# Patient Record
Sex: Female | Born: 1990
Health system: Southern US, Community
[De-identification: ages and names within clinical notes are randomized; demographics above are authoritative.]

---
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
## 2017-10-30 ENCOUNTER — Encounter: Payer: Self-pay | Admitting: Internal Medicine

## 2017-10-30 ENCOUNTER — Ambulatory Visit: Payer: Self-pay | Attending: Internal Medicine | Admitting: Internal Medicine

## 2017-10-30 VITALS — BP 118/87 | HR 85 | Temp 98.5°F | Resp 16 | Ht 63.0 in | Wt 255.0 lb

## 2017-10-30 DIAGNOSIS — N926 Irregular menstruation, unspecified: Secondary | ICD-10-CM

## 2017-10-30 DIAGNOSIS — Z131 Encounter for screening for diabetes mellitus: Secondary | ICD-10-CM

## 2017-10-30 DIAGNOSIS — Z124 Encounter for screening for malignant neoplasm of cervix: Secondary | ICD-10-CM

## 2017-10-30 DIAGNOSIS — Z833 Family history of diabetes mellitus: Secondary | ICD-10-CM | POA: Insufficient documentation

## 2017-10-30 DIAGNOSIS — Z6841 Body Mass Index (BMI) 40.0 and over, adult: Secondary | ICD-10-CM

## 2017-10-30 DIAGNOSIS — Z114 Encounter for screening for human immunodeficiency virus [HIV]: Secondary | ICD-10-CM

## 2017-10-30 DIAGNOSIS — G43009 Migraine without aura, not intractable, without status migrainosus: Secondary | ICD-10-CM

## 2017-10-30 LAB — POCT URINE PREGNANCY: PREG TEST UR: NEGATIVE

## 2017-10-30 MED ORDER — AMITRIPTYLINE HCL 10 MG PO TABS: 10.0000 mg | ORAL_TABLET | Freq: Every day | ORAL | 3 refills | Status: DC

## 2017-10-30 MED ORDER — NORGESTREL-ETHINYL ESTRADIOL 0.3-30 MG-MCG PO TABS: 1.0000 | ORAL_TABLET | Freq: Every day | ORAL | 3 refills | Status: DC

## 2017-10-30 MED FILL — AMITRIPTYLINE HCL 10 MG TAB: 10 | 30 days supply | Qty: 30 | Fill #0

## 2017-10-30 MED FILL — ELINEST-28 TABLET: 0.3-30 | 28 days supply | Qty: 28 | Fill #0

## 2017-10-30 NOTE — Progress Notes (Signed)
Pt states her menstrual are irregular  Pt states this past month she had her menstrual for the full month and it was heavy

## 2017-10-30 NOTE — Progress Notes (Signed)
Patient ID: Sheri Newman, female    DOB: February 20, 1991  MRN: [id number]  CC: New Patient (Initial Visit)   Subjective: Sheri Newman is a 27 y.o. female who presents for new patient visit. Her concerns today include:   Patient complains of irregular menses since junior high school.  Menarche started at the age of 27.  Menses were regular up until junior school.  After that menses with skipped several months sometimes almost a year.  Today she presents because she was bleeding daily for the past month.  The amount of bleeding during that month fluctuated from heavy to light.  When it was heavy she had to change her tampon about every 1 hour.  The bleeding just stopped a few days ago.  Prior to this bleeding episode her last.  Was sometime last year.  She is sexually active with one partner.  She denies any hirsutism.  She endorses headaches that occur about 2-3 times a week usually in both temple area.  Headaches usually come on suddenly and can be throbbing.  They last for up to 2 hours and is sometimes associated with nausea.  She denies photophobia but reports that the headaches are better with lying down in a quiet room.  She takes Tylenol with incomplete relief.  She does not identify any aggravating or initiating factors.  She denies any visual or other sensory or other type symptoms.  No formal diagnosis of migraines in the past.  No current outpatient medications on file prior to visit.   No current facility-administered medications on file prior to visit.     No Known Allergies  Social History   Socioeconomic History  . Marital status: Single    Spouse name: Not on file  . Number of children: Not on file  . Years of education: Not on file  . Highest education level: Not on file  Occupational History  . Not on file  Social Needs  . Financial resource strain: Not on file  . Food insecurity:    Worry: Not on file    Inability: Not on file  . Transportation needs:   Medical: Not on file    Non-medical: Not on file  Tobacco Use  . Smoking status: Never Smoker  . Smokeless tobacco: Never Used  Substance and Sexual Activity  . Alcohol use: Yes    Comment: occasionally  . Drug use: Never  . Sexual activity: Yes  Lifestyle  . Physical activity:    Days per week: Not on file    Minutes per session: Not on file  . Stress: Not on file  Relationships  . Social connections:    Talks on phone: Not on file    Gets together: Not on file    Attends religious service: Not on file    Active member of club or organization: Not on file    Attends meetings of clubs or organizations: Not on file    Relationship status: Not on file  . Intimate partner violence:    Fear of current or ex partner: Not on file    Emotionally abused: Not on file    Physically abused: Not on file    Forced sexual activity: Not on file  Other Topics Concern  . Not on file  Social History Narrative  . Not on file    Family History  Problem Relation Age of Onset  . Diabetes Mother   . Diabetes Maternal Grandmother     ROS: Review  of Systems Negative except as stated above PHYSICAL EXAM: BP 118/87   Pulse 85   Temp 98.5 F (36.9 C) (Oral)   Resp 16   Ht 5\' 3"  (1.6 m)   Wt 255 lb (115.7 kg)   SpO2 96%   BMI 45.17 kg/m   Physical Exam  General appearance - alert, well appearing, obese Hispanic female and in no distress Mental status - normal mood, behavior, speech, dress, motor activity, and thought processes Chest - clear to auscultation, no wheezes, rales or rhonchi, symmetric air entry Heart - normal rate, regular rhythm, normal S1, S2, no murmurs, rubs, clicks or gallops Neurological - cranial nerves II through XII intact, motor and sensory grossly normal bilaterally, Romberg sign negative, normal gait and station GU:  CMA Pollock present -no external vaginal lesion.  Cervix appears grossly normal.  No abnormal appearing discharge.  Uterus and ovaries feel  normal.  Results for orders placed or performed in visit on 10/30/17  POCT urine pregnancy  Result Value Ref Range   Preg Test, Ur Negative Negative    ASSESSMENT AND PLAN: 1. Irregular menses History suggests anovulatory cycle likely due to polycystic ovarian syndrome.  Patient is not wanting pregnancy at this time.  So we discussed putting her on birth control pills to regulate her cycles and she is agreeable.  She does not have any absolute contraindication to being on birth control pills.  History does suggest migraine but it is migraine without aura.  I went over slight risks of blood clots in the legs and lungs and told her signs/symptoms to watch out for. -We will check hormone levels including TSH and prolactin. Advised to apply for the orange card in the event we need to refer to gynecology. - POCT urine pregnancy - TSH - Prolactin - Follicle stimulating hormone - Luteinizing hormone - norgestrel-ethinyl estradiol (LO/OVRAL,CRYSELLE) 0.3-30 MG-MCG tablet; Take 1 tablet by mouth daily.  Dispense: 1 Package; Refill: 3  2. Class 3 severe obesity without serious comorbidity with body mass index (BMI) of 45.0 to 49.9 in adult, unspecified obesity type (HCC) Discuss importance of healthy eating habits and regular exercise to achieve weight loss.  Dietary counseling given and printed information also given.  Advised to engage in some form of aerobic activity at least 4 times a week for 30 to 45 minutes. - Hemoglobin A1c  3. Pap smear for cervical cancer screening - Cytology - PAP  4. Diabetes mellitus screening - Hemoglobin A1c  5. Migraine without aura and without status migrainosus, not intractable Discussed diagnosis with patient. Recommend ibuprofen 600 mg as needed for abortive therapy.  For prophylaxis we will try her with a low dose of amitriptyline. - amitriptyline (ELAVIL) 10 MG tablet; Take 1 tablet (10 mg total) by mouth at bedtime.  Dispense: 30 tablet; Refill: 3  6.  Screening for HIV (human immunodeficiency virus) - HIV antibody  Patient was given the opportunity to ask questions.  Patient verbalized understanding of the plan and was able to repeat key elements of the plan.   Orders Placed This Encounter  Procedures  . TSH  . Prolactin  . Follicle stimulating hormone  . Luteinizing hormone  . Hemoglobin A1c  . HIV antibody  . POCT urine pregnancy     Requested Prescriptions   Signed Prescriptions Disp Refills  . norgestrel-ethinyl estradiol (LO/OVRAL,CRYSELLE) 0.3-30 MG-MCG tablet 1 Package 3    Sig: Take 1 tablet by mouth daily.  Marland Kitchen amitriptyline (ELAVIL) 10 MG tablet 30 tablet  3    Sig: Take 1 tablet (10 mg total) by mouth at bedtime.    Return in about 6 weeks (around 12/11/2017).  Jonah Blue, MD, FACP

## 2017-10-30 NOTE — Patient Instructions (Addendum)
Follow a Healthy Eating Plan - You can do it! Limit sugary drinks.  Avoid sodas, sweet tea, sport or energy drinks, or fruit drinks.  Drink water, lo-fat milk, or diet drinks. Limit snack foods.   Cut back on candy, cake, cookies, chips, ice cream.  These are a special treat, only in small amounts. Eat plenty of vegetables.  Especially dark green, red, and orange vegetables. Aim for at least 3 servings a day. More is better! Include fruit in your daily diet.  Whole fruit is much healthier than fruit juice! Limit "white" bread, "white" pasta, "white" rice.   Choose "100% whole grain" products, brown or wild rice. Avoid fatty meats. Try "Meatless Monday" and choose eggs or beans one day a week.  When eating meat, choose lean meats like chicken, [place], and fish.  Grill, broil, or bake meats instead of frying, and eat poultry without the skin. Eat less salt.  Avoid frozen pizzas, frozen dinners and salty foods.  Use seasonings other than salt in cooking.  This can help blood pressure and keep you from swelling Beer, wine and liquor have calories.  If you can safely drink alcohol, limit to 1 drink per day for women, 2 drinks for men   Polycystic Ovarian Syndrome Polycystic ovarian syndrome (PCOS) is a common hormonal disorder among women of reproductive age. In most women with PCOS, many small fluid-filled sacs (cysts) grow on the ovaries, and the cysts are not part of a normal menstrual cycle. PCOS can cause problems with your menstrual periods and make it difficult to get pregnant. It can also cause an increased risk of miscarriage with pregnancy. If it is not treated, PCOS can lead to serious health problems, such as diabetes and heart disease. What are the causes? The cause of PCOS is not known, but it may be the result of a combination of certain factors, such as:  Irregular menstrual cycle.  High levels of certain hormones (androgens).  Problems with the hormone that helps to control blood  sugar (insulin resistance).  Certain genes.  What increases the risk? This condition is more likely to develop in women who have a family history of PCOS. What are the signs or symptoms? Symptoms of PCOS may include:  Multiple ovarian cysts.  Infrequent periods or no periods.  Periods that are too frequent or too heavy.  Unpredictable periods.  Inability to get pregnant (infertility) because of not ovulating.  Increased growth of hair on the face, chest, stomach, back, thumbs, thighs, or toes.  Acne or oily skin. Acne may develop during adulthood, and it may not respond to treatment.  Pelvic pain.  Weight gain or obesity.  Patches of thickened and dark brown or black skin on the neck, arms, breasts, or thighs (acanthosis nigricans).  Excess hair growth on the face, chest, abdomen, or upper thighs (hirsutism).  How is this diagnosed? This condition is diagnosed based on:  Your medical history.  A physical exam, including a pelvic exam. Your health care provider may look for areas of increased hair growth on your skin.  Tests, such as: ? Ultrasound. This may be used to examine the ovaries and the lining of the uterus (endometrium) for cysts. ? Blood tests. These may be used to check levels of sugar (glucose), female hormone (testosterone), and female hormones (estrogen and progesterone) in your blood.  How is this treated? There is no cure for PCOS, but treatment can help to manage symptoms and prevent more health problems from developing. Treatment  varies depending on:  Your symptoms.  Whether you want to have a baby or whether you need birth control (contraception).  Treatment may include nutrition and lifestyle changes along with:  Progesterone hormone to start a menstrual period.  Birth control pills to help you have regular menstrual periods.  Medicines to make you ovulate, if you want to get pregnant.  Medicine to reduce excessive hair growth.  Surgery, in  severe cases. This may involve making small holes in one or both of your ovaries. This decreases the amount of testosterone that your body produces.  Follow these instructions at home:  Take over-the-counter and prescription medicines only as told by your health care provider.  Follow a healthy meal plan. This can help you reduce the effects of PCOS. ? Eat a healthy diet that includes lean proteins, complex carbohydrates, fresh fruits and vegetables, low-fat dairy products, and healthy fats. Make sure to eat enough fiber.  If you are overweight, lose weight as told by your health care provider. ? Losing 10% of your body weight may improve symptoms. ? Your health care provider can determine how much weight loss is best for you and can help you lose weight safely.  Keep all follow-up visits as told by your health care provider. This is important. Contact a health care provider if:  Your symptoms do not get better with medicine.  You develop new symptoms. This information is not intended to replace advice given to you by your health care provider. Make sure you discuss any questions you have with your health care provider. Document Released: 08/02/2004 Document Revised: 12/05/2015 Document Reviewed: 09/24/2015 Elsevier Interactive Patient Education  2018 [employer].   Migraine Headache A migraine headache is a very strong throbbing pain on one side or both sides of your head. Migraines can also cause other symptoms. Talk with your doctor about what things may bring on (trigger) your migraine headaches. Follow these instructions at home: Medicines  Take over-the-counter and prescription medicines only as told by your doctor.  Do not drive or use heavy machinery while taking prescription pain medicine.  To prevent or treat constipation while you are taking prescription pain medicine, your doctor may recommend that you: ? Drink enough fluid to keep your pee (urine) clear or pale  yellow. ? Take over-the-counter or prescription medicines. ? Eat foods that are high in fiber. These include fresh fruits and vegetables, whole grains, and beans. ? Limit foods that are high in fat and processed sugars. These include fried and sweet foods. Lifestyle  Avoid alcohol.  Do not use any products that contain nicotine or tobacco, such as cigarettes and e-cigarettes. If you need help quitting, ask your doctor.  Get at least 8 hours of sleep every night.  Limit your stress. General instructions   Keep a journal to find out what may bring on your migraines. For example, write down: ? What you eat and drink. ? How much sleep you get. ? Any change in what you eat or drink. ? Any change in your medicines.  If you have a migraine: ? Avoid things that make your symptoms worse, such as bright lights. ? It may help to lie down in a dark, quiet room. ? Do not drive or use heavy machinery. ? Ask your doctor what activities are safe for you.  Keep all follow-up visits as told by your doctor. This is important. Contact a doctor if:  You get a migraine that is different or worse than your  usual migraines. Get help right away if:  Your migraine gets very bad.  You have a fever.  You have a stiff neck.  You have trouble seeing.  Your muscles feel weak or like you cannot control them.  You start to lose your balance a lot.  You start to have trouble walking.  You pass out (faint). This information is not intended to replace advice given to you by your health care provider. Make sure you discuss any questions you have with your health care provider. Document Released: 01/16/2008 Document Revised: 10/27/2015 Document Reviewed: 09/25/2015 Elsevier Interactive Patient Education  2018 [employer].

## 2017-10-31 LAB — LUTEINIZING HORMONE: LH: 9.4 m[IU]/mL

## 2017-10-31 LAB — HEMOGLOBIN A1C
ESTIMATED AVERAGE GLUCOSE: 103 mg/dL
Hgb A1c MFr Bld: 5.2 % (ref 4.8–5.6)

## 2017-10-31 LAB — TSH: TSH: 2.16 u[IU]/mL (ref 0.450–4.500)

## 2017-10-31 LAB — PROLACTIN: PROLACTIN: 20.9 ng/mL (ref 4.8–23.3)

## 2017-10-31 LAB — FOLLICLE STIMULATING HORMONE: FSH: 6.8 m[IU]/mL

## 2017-10-31 LAB — HIV ANTIBODY (ROUTINE TESTING W REFLEX): HIV Screen 4th Generation wRfx: NONREACTIVE

## 2017-11-04 LAB — CYTOLOGY - PAP
BACTERIAL VAGINITIS: NEGATIVE
CANDIDA VAGINITIS: POSITIVE — AB
CHLAMYDIA, DNA PROBE: NEGATIVE
Neisseria Gonorrhea: NEGATIVE
Trichomonas: NEGATIVE

## 2017-11-05 ENCOUNTER — Telehealth: Payer: Self-pay

## 2017-11-05 ENCOUNTER — Other Ambulatory Visit: Payer: Self-pay | Admitting: Internal Medicine

## 2017-11-05 DIAGNOSIS — R87612 Low grade squamous intraepithelial lesion on cytologic smear of cervix (LGSIL): Secondary | ICD-10-CM

## 2017-11-05 MED ORDER — FLUCONAZOLE 150 MG PO TABS: 150.0000 mg | ORAL_TABLET | Freq: Once | ORAL | 0 refills | Status: AC

## 2017-11-05 MED FILL — FLUCONAZOLE 150 MG TABS: 150 | 1 days supply | Qty: 1 | Fill #0

## 2017-11-05 NOTE — Telephone Encounter (Signed)
Contacted pt to go over lab results pt is aware and doesn't have any questions or concerns 

## 2017-11-12 ENCOUNTER — Ambulatory Visit: Payer: Self-pay | Attending: Internal Medicine

## 2017-12-08 MED FILL — ELINEST-28 TABLET: 0.3-30 | 28 days supply | Qty: 28 | Fill #1

## 2017-12-11 ENCOUNTER — Encounter: Payer: Self-pay | Admitting: Internal Medicine

## 2017-12-11 ENCOUNTER — Ambulatory Visit: Payer: Self-pay | Attending: Internal Medicine | Admitting: Internal Medicine

## 2017-12-11 VITALS — BP 122/86 | HR 74 | Temp 98.6°F | Resp 16 | Wt 250.6 lb

## 2017-12-11 DIAGNOSIS — Z6841 Body Mass Index (BMI) 40.0 and over, adult: Secondary | ICD-10-CM | POA: Insufficient documentation

## 2017-12-11 DIAGNOSIS — N926 Irregular menstruation, unspecified: Secondary | ICD-10-CM

## 2017-12-11 DIAGNOSIS — R87612 Low grade squamous intraepithelial lesion on cytologic smear of cervix (LGSIL): Secondary | ICD-10-CM | POA: Insufficient documentation

## 2017-12-11 DIAGNOSIS — G43009 Migraine without aura, not intractable, without status migrainosus: Secondary | ICD-10-CM | POA: Insufficient documentation

## 2017-12-11 DIAGNOSIS — N921 Excessive and frequent menstruation with irregular cycle: Secondary | ICD-10-CM | POA: Insufficient documentation

## 2017-12-11 DIAGNOSIS — Z833 Family history of diabetes mellitus: Secondary | ICD-10-CM | POA: Insufficient documentation

## 2017-12-11 MED ORDER — PROPRANOLOL HCL 10 MG PO TABS: 5.0000 mg | ORAL_TABLET | Freq: Two times a day (BID) | ORAL | 1 refills | Status: DC

## 2017-12-11 MED FILL — PROPRANOLOL 10 MG TABLET: 10 | 30 days supply | Qty: 30 | Fill #0

## 2017-12-11 NOTE — Progress Notes (Signed)
Patient ID: Sheri Newman, female    DOB: 11/28/1990  MRN: [id number]  CC: Follow-up   Subjective: Sheri Newman is a 27 y.o. female who presents for 6 wks f/u for irregular menses and HA Her concerns today include:  Pt with hx of obesity, irregular menses, abn PAP, migraine without aura  Irregular menses: started on BCP on last visit.  Had menses one time since started BCP.  Menses lasted 8 days and was very heavy from 3rd to 5th day.  + clots, moderate cramps.  Had to change tampons every 1 hr to prevent from soaking through.  -hormone levels were okay.  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  Referred to GYN.  She was declined the orange card.  She is afraid that she would not be able to afford to see the gynecologist. -migraines no worse with being on BCP.  Still gets them several times a wk despite the Elavil.  Patient Active Problem List   Diagnosis Date Noted  . Irregular menses 10/30/2017  . Class 3 severe obesity without serious comorbidity with body mass index (BMI) of 45.0 to 49.9 in adult (HCC) 10/30/2017  . Migraine without aura and without status migrainosus, not intractable 10/30/2017     Current Outpatient Medications on File Prior to Visit  Medication Sig Dispense Refill  . norgestrel-ethinyl estradiol (LO/OVRAL,CRYSELLE) 0.3-30 MG-MCG tablet Take 1 tablet by mouth daily. 1 Package 3   No current facility-administered medications on file prior to visit.     No Known Allergies  Social History   Socioeconomic History  . Marital status: Single    Spouse name: Not on file  . Number of children: Not on file  . Years of education: Not on file  . Highest education level: Not on file  Occupational History  . Not on file  Social Needs  . Financial resource strain: Not on file  . Food insecurity:    Worry: Not on file    Inability: Not on file  . Transportation needs:    Medical: Not on file    Non-medical: Not on file  Tobacco Use  . Smoking  status: Never Smoker  . Smokeless tobacco: Never Used  Substance and Sexual Activity  . Alcohol use: Yes    Comment: occasionally  . Drug use: Never  . Sexual activity: Yes  Lifestyle  . Physical activity:    Days per week: Not on file    Minutes per session: Not on file  . Stress: Not on file  Relationships  . Social connections:    Talks on phone: Not on file    Gets together: Not on file    Attends religious service: Not on file    Active member of club or organization: Not on file    Attends meetings of clubs or organizations: Not on file    Relationship status: Not on file  . Intimate partner violence:    Fear of current or ex partner: Not on file    Emotionally abused: Not on file    Physically abused: Not on file    Forced sexual activity: Not on file  Other Topics Concern  . Not on file  Social History Narrative  . Not on file    Family History  Problem Relation Age of Onset  . Diabetes Mother   . Diabetes Maternal Grandmother     ROS: Review of Systems Negative except as stated above PHYSICAL EXAM: BP 122/86   Pulse  74   Temp 98.6 F (37 C) (Oral)   Resp 16   Wt 250 lb 9.6 oz (113.7 kg)   SpO2 97%   BMI 44.39 kg/m   Physical Exam  General appearance - alert, well appearing, and in no distress Mental status - normal mood, behavior, speech, dress, motor activity, and thought processes Chest - clear to auscultation, no wheezes, rales or rhonchi, symmetric air entry Heart - normal rate, regular rhythm, normal S1, S2, no murmurs, rubs, clicks or gallops Extremities - peripheral pulses normal, no pedal edema, no clubbing or cyanosis Results for orders placed or performed in visit on 10/30/17  TSH  Result Value Ref Range   TSH 2.160 0.450 - 4.500 uIU/mL  Prolactin  Result Value Ref Range   Prolactin 20.9 4.8 - 23.3 ng/mL  Follicle stimulating hormone  Result Value Ref Range   FSH 6.8 mIU/mL  Luteinizing hormone  Result Value Ref Range   LH 9.4  mIU/mL  Hemoglobin A1c  Result Value Ref Range   Hgb A1c MFr Bld 5.2 4.8 - 5.6 %   Est. average glucose Bld gHb Est-mCnc 103 mg/dL  HIV antibody  Result Value Ref Range   HIV Screen 4th Generation wRfx Non Reactive Non Reactive  POCT urine pregnancy  Result Value Ref Range   Preg Test, Ur Negative Negative  Cytology - PAP  Result Value Ref Range   Adequacy (A)     Satisfactory for evaluation  endocervical/transformation zone component PRESENT.   Diagnosis (A)     - LOW GRADE SQUAMOUS INTRAEPITHELIAL LESION: CIN-1/ HPV (LSIL)   Diagnosis (A)     -  FUNGAL ORGANISMS PRESENT CONSISTENT WITH CANDIDA SPP.   Chlamydia Negative    Neisseria gonorrhea Negative    Bacterial vaginitis Negative for Bacterial Vaginitis Microorganisms    Candida vaginitis POSITIVE for Candida species (A)    Trichomonas Negative    Material Submitted CervicoVaginal Pap [ThinPrep Imaged] (A)     ASSESSMENT AND PLAN: 1. Irregular menses 2. Menorrhagia with irregular cycle - CBC - US Pelvis Complete; Future - US Transvaginal Non-OB; Future  3. LGSIL on Pap smear of cervix I have notified our referral coordinator that patient was not approved for the orange card.  She will meet with her today to give her options for seeing a gynecologist.  I stressed to the patient that this is of utmost importance that she follow-up on this.  4. Migraine without aura and without status migrainosus, not intractable Stop Elavil - propranolol (INDERAL) 10 MG tablet; Take 0.5 tablets (5 mg total) by mouth 2 (two) times daily.  Dispense: 60 tablet; Refill: 1   Patient was given the opportunity to ask questions.  Patient verbalized understanding of the plan and was able to repeat key elements of the plan.   Orders Placed This Encounter  Procedures  . US Pelvis Complete  . US Transvaginal Non-OB  . CBC     Requested Prescriptions   Signed Prescriptions Disp Refills  . propranolol (INDERAL) 10 MG tablet 60 tablet 1     Sig: Take 0.5 tablets (5 mg total) by mouth 2 (two) times daily.    Return in about 2 months (around 02/10/2018).  Sheri Blue, MD, FACP

## 2017-12-11 NOTE — Patient Instructions (Signed)
Stop Amitriptyline. Start Propranolol 10 mg 1/2 tab twice a day.   You need tto see gynecology for the abnormal pap smear.

## 2017-12-12 ENCOUNTER — Telehealth (HOSPITAL_COMMUNITY): Payer: Self-pay

## 2017-12-12 LAB — CBC
Hematocrit: 40.1 % (ref 34.0–46.6)
Hemoglobin: 13.6 g/dL (ref 11.1–15.9)
MCH: 28.3 pg (ref 26.6–33.0)
MCHC: 33.9 g/dL (ref 31.5–35.7)
MCV: 83 fL (ref 79–97)
PLATELETS: 256 10*3/uL (ref 150–450)
RBC: 4.81 x10E6/uL (ref 3.77–5.28)
RDW: 13.3 % (ref 12.3–15.4)
WBC: 7 10*3/uL (ref 3.4–10.8)

## 2017-12-12 NOTE — Telephone Encounter (Signed)
Tried to reach I left a message to call [facility] °

## 2017-12-17 ENCOUNTER — Ambulatory Visit (HOSPITAL_COMMUNITY)
Admission: RE | Admit: 2017-12-17 | Discharge: 2017-12-17 | Disposition: A | Discharge status: Home / Self Care | Payer: Self-pay | Source: Ambulatory Visit | Attending: Internal Medicine | Admitting: Internal Medicine

## 2017-12-17 DIAGNOSIS — N921 Excessive and frequent menstruation with irregular cycle: Secondary | ICD-10-CM | POA: Insufficient documentation

## 2018-02-02 ENCOUNTER — Encounter: Payer: Self-pay | Admitting: Obstetrics and Gynecology

## 2018-02-12 ENCOUNTER — Ambulatory Visit: Payer: Self-pay | Attending: Internal Medicine | Admitting: Internal Medicine

## 2018-02-12 ENCOUNTER — Encounter: Payer: Self-pay | Admitting: Internal Medicine

## 2018-02-12 VITALS — BP 110/78 | HR 84 | Temp 98.3°F | Resp 16 | Wt 241.6 lb

## 2018-02-12 DIAGNOSIS — Z6841 Body Mass Index (BMI) 40.0 and over, adult: Secondary | ICD-10-CM | POA: Insufficient documentation

## 2018-02-12 DIAGNOSIS — R87612 Low grade squamous intraepithelial lesion on cytologic smear of cervix (LGSIL): Secondary | ICD-10-CM | POA: Insufficient documentation

## 2018-02-12 DIAGNOSIS — Z2821 Immunization not carried out because of patient refusal: Secondary | ICD-10-CM

## 2018-02-12 DIAGNOSIS — N926 Irregular menstruation, unspecified: Secondary | ICD-10-CM | POA: Insufficient documentation

## 2018-02-12 DIAGNOSIS — G43009 Migraine without aura, not intractable, without status migrainosus: Secondary | ICD-10-CM | POA: Insufficient documentation

## 2018-02-12 DIAGNOSIS — Z760 Encounter for issue of repeat prescription: Secondary | ICD-10-CM | POA: Insufficient documentation

## 2018-02-12 MED ORDER — NORGESTREL-ETHINYL ESTRADIOL 0.3-30 MG-MCG PO TABS: 1.0000 | ORAL_TABLET | Freq: Every day | ORAL | 6 refills | Status: AC

## 2018-02-12 NOTE — Progress Notes (Signed)
Patient ID: Sheri Newman, female    DOB: [birth year]  MRN: [id number]  CC: Follow-up (2 month )   Subjective: Sheri Newman is a 27 y.o. female who presents for chronic disease management Her concerns today include:  Pt with hx of obesity, irregular menses, abn PAP, migraine without aura  Abnormal PAP smear: On last visit our referral coordinator called GYN and made appt for her.  Pt states she was subsequently called by GYN and appt cancelled.  She was told she needs to apply for [facility] program.  She called and has been playing phone tags with them trying to figure out how to go about applying for the [facility] program.  She then got busy and distracted by other life events and has not followed up on it since.  Irregular Menses: out of BCP x 1 mth.  Menses were regular on BCP lasting about 7 days. This mth it lasted 3 days without BCP.  She is requesting refill on the birth control pills.  Obesity:  Loss 9 lbs since last visit.  "I'm trying to watch what I'm eating."  Goes to gym 2-3 times a wk for cardio work out on bike and elliptical.    Migraines: she reports she has not been bothered by migraines in a while. She was not taking the Propranolol daily as prescribed.  Patient Active Problem List   Diagnosis Date Noted  . LGSIL on Pap smear of cervix 12/11/2017  . Irregular menses 10/30/2017  . Class 3 severe obesity without serious comorbidity with body mass index (BMI) of 45.0 to 49.9 in adult (HCC) 10/30/2017  . Migraine without aura and without status migrainosus, not intractable 10/30/2017     No current outpatient medications on file prior to visit.   No current facility-administered medications on file prior to visit.     No Known Allergies  Social History   Socioeconomic History  . Marital status: Single    Spouse name: Not on file  . Number of children: Not on file  . Years of education: Not on file  . Highest education level: Not on file    Occupational History  . Not on file  Social Needs  . Financial resource strain: Not on file  . Food insecurity:    Worry: Not on file    Inability: Not on file  . Transportation needs:    Medical: Not on file    Non-medical: Not on file  Tobacco Use  . Smoking status: Never Smoker  . Smokeless tobacco: Never Used  Substance and Sexual Activity  . Alcohol use: Yes    Comment: occasionally  . Drug use: Never  . Sexual activity: Yes  Lifestyle  . Physical activity:    Days per week: Not on file    Minutes per session: Not on file  . Stress: Not on file  Relationships  . Social connections:    Talks on phone: Not on file    Gets together: Not on file    Attends religious service: Not on file    Active member of club or organization: Not on file    Attends meetings of clubs or organizations: Not on file    Relationship status: Not on file  . Intimate partner violence:    Fear of current or ex partner: Not on file    Emotionally abused: Not on file    Physically abused: Not on file    Forced sexual activity: Not on file  Other Topics Concern  . Not on file  Social History Narrative  . Not on file    Family History  Problem Relation Age of Onset  . Diabetes Mother   . Diabetes Maternal Grandmother     ROS: Review of Systems Negative except as above PHYSICAL EXAM: BP 110/78   Pulse 84   Temp 98.3 F (36.8 C) (Oral)   Resp 16   Wt 241 lb 9.6 oz (109.6 kg)   SpO2 95%   BMI 42.80 kg/m   Wt Readings from Last 3 Encounters:  02/12/18 241 lb 9.6 oz (109.6 kg)  12/11/17 250 lb 9.6 oz (113.7 kg)  10/30/17 255 lb (115.7 kg)   Physical Exam  General appearance - alert, well appearing, obese young female and in no distress Mental status - normal mood, behavior, speech, dress, motor activity, and thought processes Chest - clear to auscultation, no wheezes, rales or rhonchi, symmetric air entry Heart - normal rate, regular rhythm, normal S1, S2, no murmurs, rubs,  clicks or gallops Extremities - peripheral pulses normal, no pedal edema, no clubbing or cyanosis  ASSESSMENT AND PLAN: 1. Irregular menses - norgestrel-ethinyl estradiol (LO/OVRAL,CRYSELLE) 0.3-30 MG-MCG tablet; Take 1 tablet by mouth daily.  Dispense: 1 Package; Refill: 6  2. Migraine without aura and without status migrainosus, not intractable I discontinued the propranolol since she is not taking it regularly.  She has not been bothered with migraines lately.  Encourage adequate sleep, healthy eating habits and continued regular exercise.  3. LGSIL on Pap smear of cervix I had my CMA give her the phone contact with the [facility].  I encouraged her to call and apply for it so that we can get her to GYN.  4. Class 3 severe obesity due to excess calories without serious comorbidity with body mass index (BMI) of 40.0 to 44.9 in adult [facility]) Commended her on weight loss so far.  Encouraged her to continue healthy eating habits and regular exercise.  Advised her to get in about 150 minutes/week of aerobic exercise  5. Influenza vaccination declined   Patient was given the opportunity to ask questions.  Patient verbalized understanding of the plan and was able to repeat key elements of the plan.   No orders of the defined types were placed in this encounter.    Requested Prescriptions   Signed Prescriptions Disp Refills  . norgestrel-ethinyl estradiol (LO/OVRAL,CRYSELLE) 0.3-30 MG-MCG tablet 1 Package 6    Sig: Take 1 tablet by mouth daily.    Return in about 4 months (around 06/15/2018).  Jonah Blue, MD, FACP

## 2018-02-12 NOTE — Patient Instructions (Addendum)
Please call the [facility] (breast and cervical cancer control program) at [phone] to schedule diagnostic mammogram   

## 2018-06-15 ENCOUNTER — Ambulatory Visit: Payer: Self-pay | Admitting: Internal Medicine

## 2018-06-16 ENCOUNTER — Ambulatory Visit: Payer: Self-pay | Admitting: Internal Medicine

## 2019-02-26 ENCOUNTER — Other Ambulatory Visit: Payer: Self-pay

## 2019-02-26 DIAGNOSIS — Z20822 Contact with and (suspected) exposure to covid-19: Secondary | ICD-10-CM

## 2019-02-27 LAB — NOVEL CORONAVIRUS, NAA: SARS-CoV-2, NAA: NOT DETECTED

## 2019-03-16 ENCOUNTER — Other Ambulatory Visit: Payer: Self-pay

## 2019-03-16 DIAGNOSIS — Z20822 Contact with and (suspected) exposure to covid-19: Secondary | ICD-10-CM

## 2019-03-18 LAB — NOVEL CORONAVIRUS, NAA: SARS-CoV-2, NAA: NOT DETECTED

## 2019-05-24 ENCOUNTER — Ambulatory Visit: Payer: Self-pay | Attending: Internal Medicine

## 2019-05-24 DIAGNOSIS — Z20822 Contact with and (suspected) exposure to covid-19: Secondary | ICD-10-CM | POA: Insufficient documentation

## 2019-05-25 LAB — NOVEL CORONAVIRUS, NAA: SARS-CoV-2, NAA: NOT DETECTED

## 2019-12-13 IMAGING — US US TRANSVAGINAL NON-OB
1 series · 13 of 25 positions shown · non-contrast
Comparison: None

CLINICAL DATA: Initial evaluation for irregular menses with
menorrhagia.

EXAM:
TRANSABDOMINAL AND TRANSVAGINAL ULTRASOUND OF PELVIS
TECHNIQUE: Both transabdominal and transvaginal ultrasound examinations of the
pelvis were performed. Transabdominal technique was performed for
global imaging of the pelvis including uterus, ovaries, adnexal
regions, and pelvic cul-de-sac. It was necessary to proceed with
endovaginal exam following the transabdominal exam to visualize the
pelvic structures.

[Series 1: us transvaginal non-ob · 0.20mm/px · 13 of 98 slices shown]
[im 1/98]
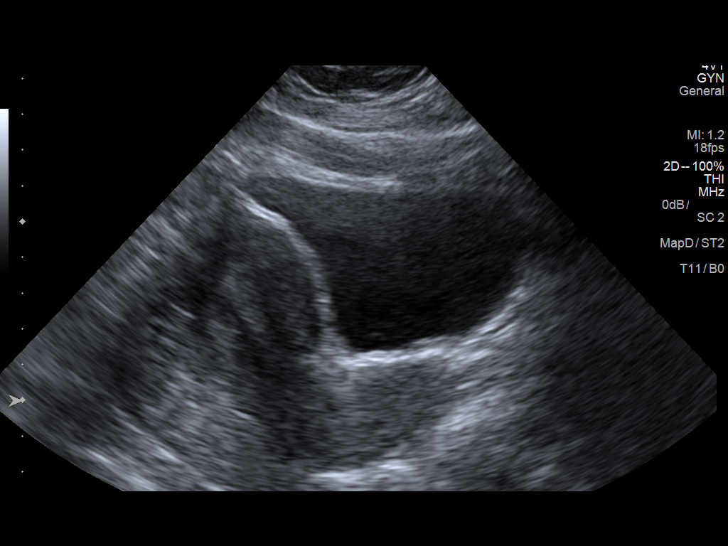
[im 9/98]
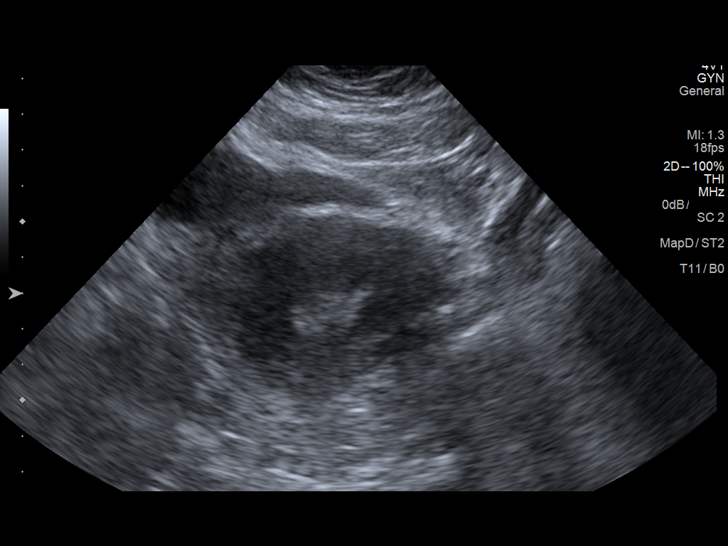
[im 17/98]
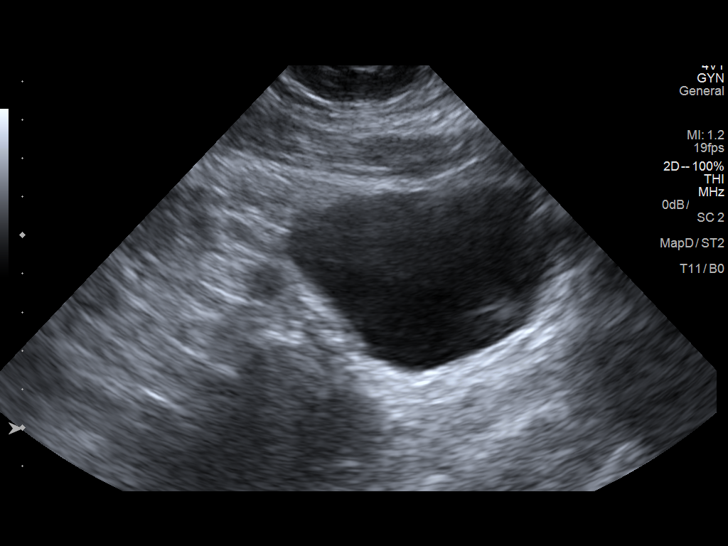
[im 25/98]
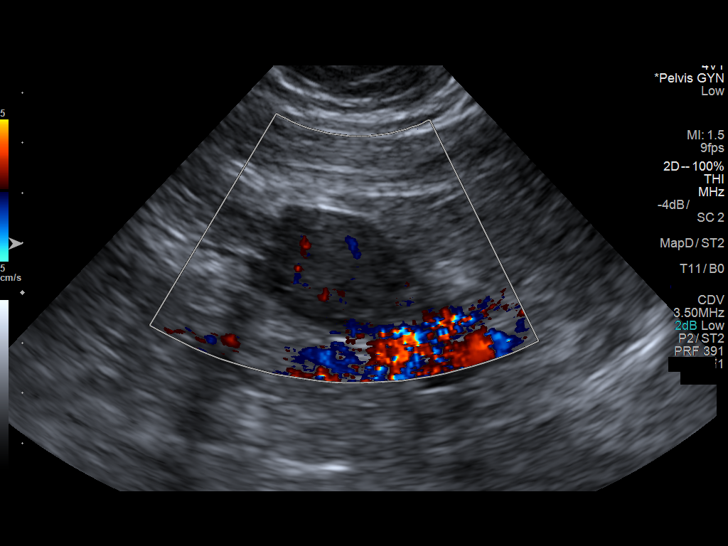
[im 33/98]
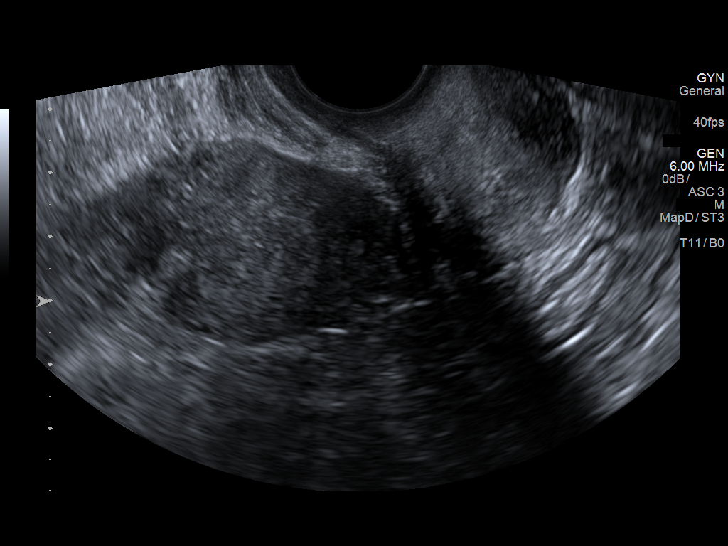
[im 41/98]
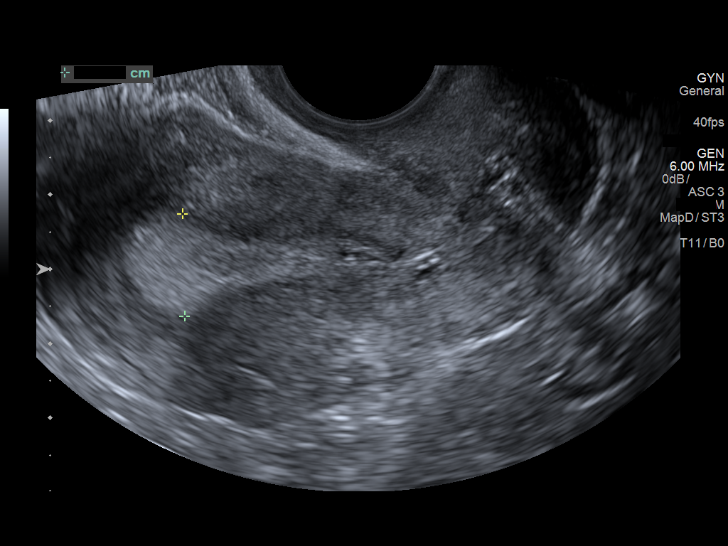
[im 49/98]
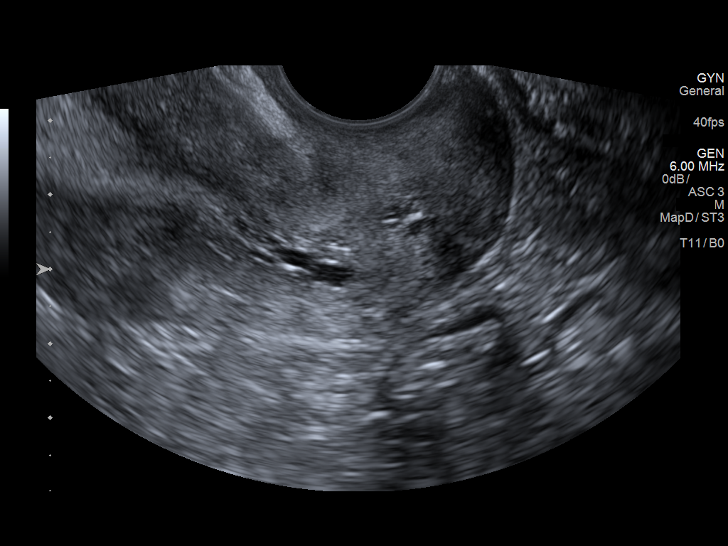
[im 57/98]
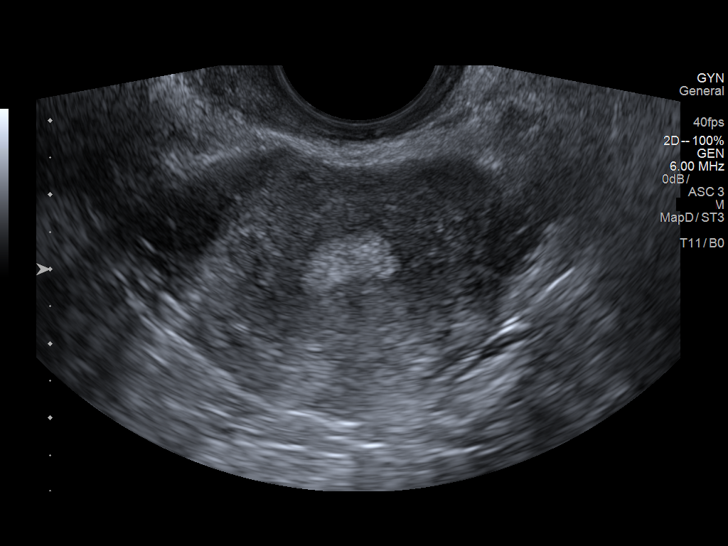
[im 65/98]
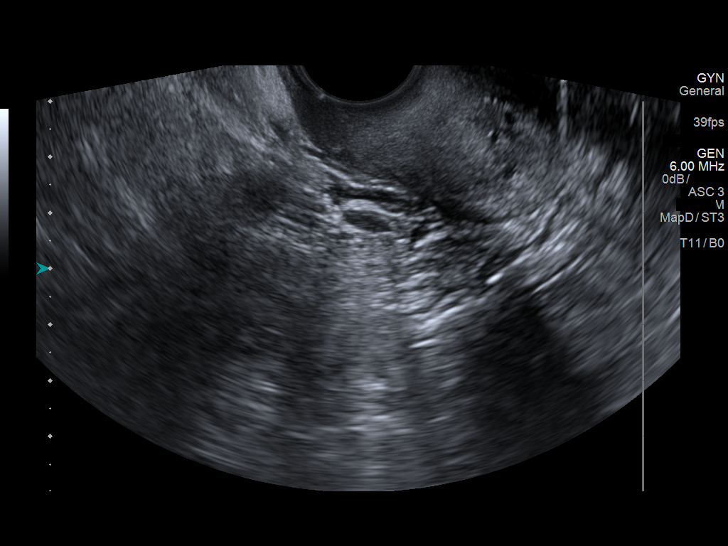
[im 73/98]
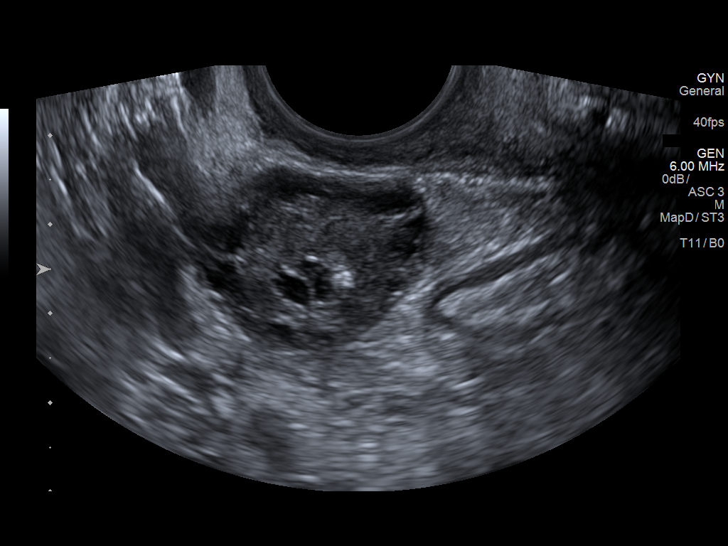
[im 81/98]
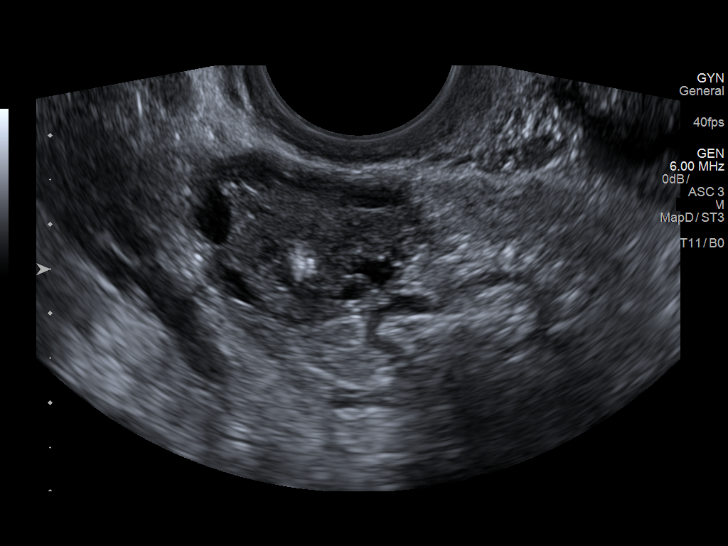
[im 89/98]
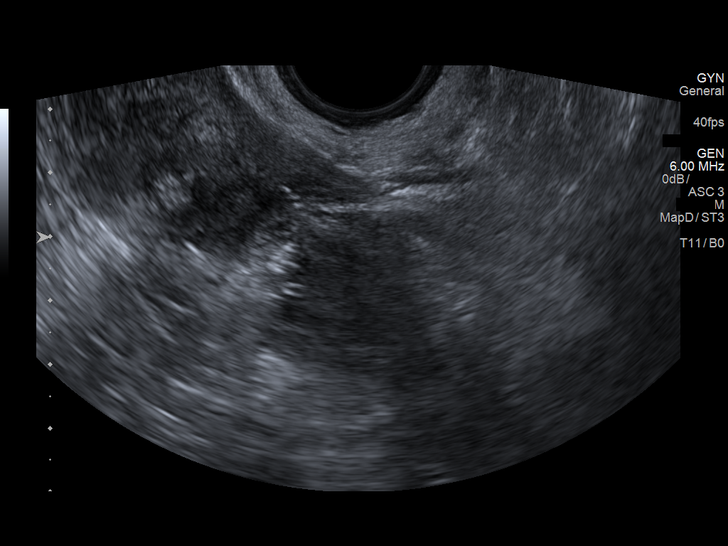
[im 98/98]
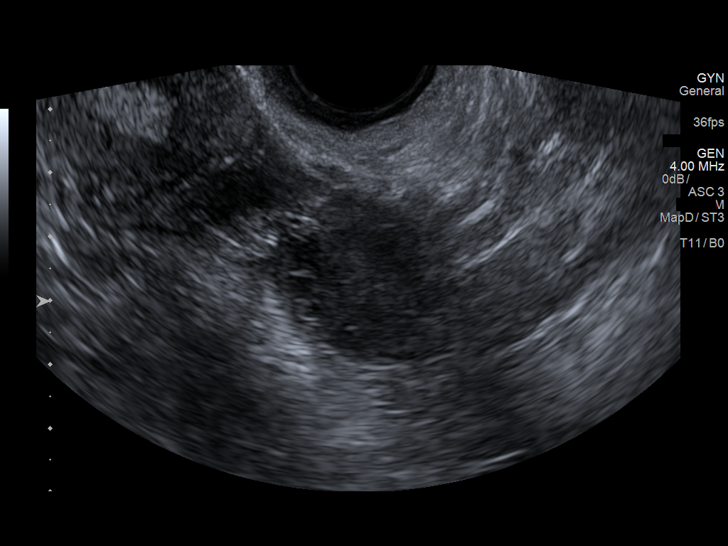

[13 of 25 positions shown; findings below may reference images not displayed]

FINDINGS: Uterus

Measurements: 7.7 x 4.1 x 5.5 cm. No fibroids or other mass
visualized.

Endometrium

Thickness: 13.8 mm.  No focal abnormality visualized.

Right ovary

Measurements: 2.8 x 2.0 x 2.8 cm. Somewhat prominent peripherally
located follicles. 4 mm echogenic focus within the central aspect of
the ovary noted, most likely a small focus of calcification, of
doubtful significance. No other adnexal mass.

Left ovary

Measurements: 3.4 x 2.4 x 2.9 cm. Somewhat prominent peripherally
located follicles. No adnexal mass.

Other findings

No abnormal free fluid.
IMPRESSION: 1. Endometrial stripe within normal limits measuring 13.8 mm in
thickness. If bleeding remains unresponsive to hormonal or medical
therapy, sonohysterogram should be considered for focal lesion
work-up. (Ref: Radiological Reasoning: Algorithmic Workup of
Abnormal Vaginal Bleeding with Endovaginal Sonography and
Sonohysterography. AJR 2008; 191:S68-73).
2. Mildly prominent peripherally located follicles within the
ovaries bilaterally. Clinical correlation for possible PCOS
recommended.
3. Otherwise unremarkable and normal pelvic ultrasound.

## 2021-11-06 ENCOUNTER — Emergency Department (HOSPITAL_COMMUNITY)
Admission: EM | Admit: 2021-11-06 | Discharge: 2021-11-06 | Disposition: A | Discharge status: Home / Self Care | Payer: Self-pay | Attending: Emergency Medicine | Admitting: Emergency Medicine

## 2021-11-06 ENCOUNTER — Other Ambulatory Visit: Payer: Self-pay

## 2021-11-06 DIAGNOSIS — T783XXA Angioneurotic edema, initial encounter: Secondary | ICD-10-CM | POA: Insufficient documentation

## 2021-11-06 MED ORDER — PREDNISONE 20 MG PO TABS: 40.0000 mg | ORAL_TABLET | Freq: Every day | ORAL | 0 refills | Status: AC

## 2021-11-06 MED ORDER — DIPHENHYDRAMINE HCL 25 MG PO CAPS
25.0000 mg | ORAL_CAPSULE | Freq: Once | ORAL | Status: AC
  Administered 2021-11-06: 25 mg via ORAL

## 2021-11-06 MED ORDER — FAMOTIDINE 20 MG PO TABS
ORAL_TABLET | ORAL | Status: AC
  Filled 2021-11-06: qty 1

## 2021-11-06 MED ORDER — PREDNISONE 20 MG PO TABS
ORAL_TABLET | ORAL | Status: AC
  Filled 2021-11-06: qty 3

## 2021-11-06 MED ORDER — PREDNISONE 20 MG PO TABS
60.0000 mg | ORAL_TABLET | Freq: Once | ORAL | Status: AC
  Administered 2021-11-06: 60 mg via ORAL

## 2021-11-06 MED ORDER — DIPHENHYDRAMINE HCL 25 MG PO CAPS
ORAL_CAPSULE | ORAL | Status: AC
  Filled 2021-11-06: qty 1

## 2021-11-06 MED ORDER — FAMOTIDINE 20 MG PO TABS
20.0000 mg | ORAL_TABLET | Freq: Once | ORAL | Status: AC
  Administered 2021-11-06: 20 mg via ORAL

## 2021-11-06 NOTE — ED Triage Notes (Addendum)
Pt noticed her right chin feeling "hard" about an hour ago.  Then swelling to upper lip.  Now swelling to entire chin.  Pt has no shob and no tongue swelling.  Does not know what she came into contact with.  Pt did take 1 benadryl and "another allergy pill" PTA

## 2021-11-06 NOTE — Discharge Instructions (Signed)
Take prednisone as prescribed until finished.  We recommend that you also use daily Zyrtec or Claritin over the next few days.  Follow-up with your primary care doctor.  Return to the ED for new or concerning symptoms.

## 2021-11-06 NOTE — ED Provider Triage Note (Signed)
Emergency Medicine Provider Triage Evaluation Note  Sheri Newman , a 31 y.o. female  was evaluated in triage.  Pt complains of allergic reaction.  Patient with onset of facial swelling 1 hour ago. Started with her chin and migrated to her left eye and upper lip. Took benadryl and generic Claritin PTA. Has had some improvement to swelling around eye. No inability to swallow, drooling. Denies new contacts or ingestions.  Review of Systems  Positive: As above Negative: As above  Physical Exam  There were no vitals taken for this visit. Gen:   Awake, no distress   Resp:  Normal effort  MSK:   Moves extremities without difficulty  Other:  Angioedema of upper lip and lower face. No posterior oropharyngeal edema. Tolerating secretions. Normal phonation. Lungs CTAB.  Medical Decision Making  Medically screening exam initiated at 1:06 AM.  Appropriate orders placed.  Sheri Newman was informed that the remainder of the evaluation will be completed by another provider, this initial triage assessment does not replace that evaluation, and the importance of remaining in the ED until their evaluation is complete.  Angioedema - Benadryl, pepcid, prednisone ordered    Antony Madura, PA-C 11/06/21 0109

## 2021-11-06 NOTE — ED Provider Notes (Signed)
[facility] EMERGENCY DEPARTMENT Provider Note   CSN: [id number] Arrival date & time: 11/06/21  0036     History  Chief Complaint  Patient presents with   Allergic Reaction    Sheri Newman is a 31 y.o. female.  Pt complains of allergic reaction.  Patient with onset of facial swelling 1 hour ago. Started with her chin and migrated to her left eye and upper lip. Took benadryl and generic Claritin PTA. Has had some improvement to swelling around eye. No inability to swallow, drooling. Denies new contacts or ingestions.  The history is provided by the patient. No language interpreter was used.  Allergic Reaction      Home Medications Prior to Admission medications   Medication Sig Start Date End Date Taking? Authorizing Provider  predniSONE (DELTASONE) 20 MG tablet Take 2 tablets (40 mg total) by mouth daily. 11/06/21  Yes Antony Madura, PA-C  norgestrel-ethinyl estradiol (LO/OVRAL,CRYSELLE) 0.3-30 MG-MCG tablet Take 1 tablet by mouth daily. 02/12/18   Marcine Matar, MD      Allergies    Patient has no known allergies.    Review of Systems   Review of Systems Ten systems reviewed and are negative for acute change, except as noted in the HPI.    Physical Exam Updated Vital Signs BP (!) 147/79 (BP Location: Right Arm)   Pulse 88   Temp 98.1 F (36.7 C) (Oral)   Resp 18   SpO2 96%   Physical Exam Vitals and nursing note reviewed.  Constitutional:      General: She is not in acute distress.    Appearance: She is well-developed. She is not diaphoretic.     Comments: Nontoxic appearing and in NAD  HENT:     Head: Normocephalic and atraumatic.     Mouth/Throat:     Mouth: Mucous membranes are moist.     Comments: Mild angioedema of the upper lip as well as to lateral aspects of chin. Some mild angioedema of the L infraorbital region and L eyelid. Oropharynx clear. No stridor or tripoding. Tolerating secretions w/o difficulty. Eyes:      General: No scleral icterus.    Extraocular Movements: Extraocular movements intact.     Conjunctiva/sclera: Conjunctivae normal.  Cardiovascular:     Rate and Rhythm: Normal rate and regular rhythm.     Pulses: Normal pulses.  Pulmonary:     Effort: Pulmonary effort is normal. No respiratory distress.     Breath sounds: No stridor. No wheezing.     Comments: Respirations even and unlabored. No wheezing. Musculoskeletal:        General: Normal range of motion.     Cervical back: Normal range of motion.  Skin:    General: Skin is warm and dry.     Coloration: Skin is not pale.     Findings: No erythema or rash.  Neurological:     Mental Status: She is alert and oriented to person, place, and time.     Coordination: Coordination normal.  Psychiatric:        Behavior: Behavior normal.     ED Results / Procedures / Treatments   Labs (all labs ordered are listed, but only abnormal results are displayed) Labs Reviewed - No data to display  EKG None  Radiology No results found.  Procedures Procedures    Medications Ordered in ED Medications  famotidine (PEPCID) tablet 20 mg (20 mg Oral Given 11/06/21 0120)  predniSONE (DELTASONE) tablet 60 mg (60  mg Oral Given 11/06/21 0120)  diphenhydrAMINE (BENADRYL) capsule 25 mg (25 mg Oral Given 11/06/21 0120)    ED Course/ Medical Decision Making/ A&P                           Medical Decision Making Risk Prescription drug management.   This patient presents to the ED for concern of facial swelling, this involves an extensive number of treatment options, and is a complaint that carries with it a high risk of complications and morbidity.  The differential diagnosis includes allergic reaction vs ACE-I angioedema vs cellulitis vs abscess vs anaphylaxis   Co morbidities that complicate the patient evaluation  Obesity    Additional history obtained:  Additional history obtained from friend at bedside   Medicines ordered  and prescription drug management:  I ordered medication including Pepcid, Prednisone, and Benadryl for suspected allergic reaction  Reevaluation of the patient after these medicines showed that the patient improved I have reviewed the patients home medicines and have made adjustments as needed   Critical Interventions:  Initiation of steroids and histamine blockers to prevent progression of allergic reaction   Problem List / ED Course:  Allergic reaction of unclear cause. No use of ACE-I. Denies new food ingestions, topical contacts, medication changes. Patient with improving symptoms following medical intervention. No criteria for anaphylaxis Maintained clear airway and ability to tolerate secretions, clear lung sounds, normal O2 sats, stable BP   Reevaluation:  After the interventions noted above, I reevaluated the patient and found that they have :improved   Social Determinants of Health:  Uninsured    Dispostion:  After consideration of the diagnostic results and the patients response to treatment, I feel that the patent would benefit from outpatient course of prednisone given unclear cause of symptoms today. Encouraged establishment with PCP for follow up. Return precautions discussed and provided. Patient discharged in stable condition with no unaddressed concerns.          Final Clinical Impression(s) / ED Diagnoses Final diagnoses:  Angioedema, initial encounter    Rx / DC Orders ED Discharge Orders          Ordered    predniSONE (DELTASONE) 20 MG tablet  Daily        11/06/21 0457              Antony Madura, PA-C 11/17/21 1627    Dione Booze, MD 11/20/21 2240
